# Patient Record
Sex: Female | Born: 1945 | Race: White | Hispanic: No | Marital: Married | Smoking: Never smoker
Health system: Southern US, Community
[De-identification: ages and names within clinical notes are randomized; demographics above are authoritative.]

## PROBLEM LIST (undated history)

## (undated) DIAGNOSIS — I4891 Unspecified atrial fibrillation: Secondary | ICD-10-CM

## (undated) DIAGNOSIS — D369 Benign neoplasm, unspecified site: Secondary | ICD-10-CM

## (undated) DIAGNOSIS — M1990 Unspecified osteoarthritis, unspecified site: Secondary | ICD-10-CM

## (undated) DIAGNOSIS — E538 Deficiency of other specified B group vitamins: Secondary | ICD-10-CM

## (undated) DIAGNOSIS — I8390 Asymptomatic varicose veins of unspecified lower extremity: Secondary | ICD-10-CM

## (undated) DIAGNOSIS — I10 Essential (primary) hypertension: Secondary | ICD-10-CM

## (undated) HISTORY — PX: CHOLECYSTECTOMY: SHX55

## (undated) HISTORY — PX: COLONOSCOPY: SHX174

## (undated) HISTORY — PX: ABDOMINAL HYSTERECTOMY: SHX81

## (undated) HISTORY — PX: OOPHORECTOMY: SHX86

---

## 1998-02-15 ENCOUNTER — Inpatient Hospital Stay (HOSPITAL_COMMUNITY): Admission: RE | Admit: 1998-02-15 | Discharge: 1998-02-17 | Payer: Self-pay | Admitting: Obstetrics and Gynecology

## 1999-04-06 ENCOUNTER — Other Ambulatory Visit: Admission: RE | Admit: 1999-04-06 | Discharge: 1999-04-06 | Payer: Self-pay | Admitting: Obstetrics and Gynecology

## 2004-05-07 ENCOUNTER — Ambulatory Visit: Payer: Self-pay | Admitting: Internal Medicine

## 2004-10-09 ENCOUNTER — Ambulatory Visit: Payer: Self-pay

## 2005-07-17 ENCOUNTER — Ambulatory Visit: Payer: Self-pay | Admitting: Internal Medicine

## 2006-07-21 ENCOUNTER — Ambulatory Visit: Payer: Self-pay | Admitting: Unknown Physician Specialty

## 2006-09-18 ENCOUNTER — Ambulatory Visit: Payer: Self-pay | Admitting: Internal Medicine

## 2007-10-08 ENCOUNTER — Ambulatory Visit: Payer: Self-pay | Admitting: Internal Medicine

## 2008-11-22 ENCOUNTER — Ambulatory Visit: Payer: Self-pay | Admitting: Internal Medicine

## 2010-02-05 ENCOUNTER — Ambulatory Visit: Payer: Self-pay | Admitting: Internal Medicine

## 2010-03-16 ENCOUNTER — Ambulatory Visit: Payer: Self-pay | Admitting: Internal Medicine

## 2011-03-21 ENCOUNTER — Ambulatory Visit: Payer: Self-pay | Admitting: Internal Medicine

## 2011-06-26 ENCOUNTER — Ambulatory Visit: Payer: Self-pay | Admitting: Orthopedic Surgery

## 2012-04-06 ENCOUNTER — Ambulatory Visit: Payer: Self-pay | Admitting: Internal Medicine

## 2013-04-12 ENCOUNTER — Ambulatory Visit: Payer: Self-pay | Admitting: Internal Medicine

## 2014-05-12 ENCOUNTER — Ambulatory Visit: Payer: Self-pay | Admitting: Internal Medicine

## 2015-03-13 ENCOUNTER — Other Ambulatory Visit: Payer: Self-pay | Admitting: Internal Medicine

## 2015-03-13 DIAGNOSIS — Z1231 Encounter for screening mammogram for malignant neoplasm of breast: Secondary | ICD-10-CM

## 2015-05-15 ENCOUNTER — Ambulatory Visit
Admission: RE | Admit: 2015-05-15 | Discharge: 2015-05-15 | Disposition: A | Discharge status: Home / Self Care | Payer: Medicare HMO | Source: Ambulatory Visit | Attending: Internal Medicine | Admitting: Internal Medicine

## 2015-05-15 ENCOUNTER — Other Ambulatory Visit: Payer: Self-pay | Admitting: Internal Medicine

## 2015-05-15 DIAGNOSIS — Z1231 Encounter for screening mammogram for malignant neoplasm of breast: Secondary | ICD-10-CM | POA: Insufficient documentation

## 2016-03-14 ENCOUNTER — Other Ambulatory Visit: Payer: Self-pay | Admitting: Internal Medicine

## 2016-03-14 DIAGNOSIS — Z1231 Encounter for screening mammogram for malignant neoplasm of breast: Secondary | ICD-10-CM

## 2016-05-16 ENCOUNTER — Ambulatory Visit
Admission: RE | Admit: 2016-05-16 | Discharge: 2016-05-16 | Disposition: A | Discharge status: Home / Self Care | Payer: Medicare HMO | Source: Ambulatory Visit | Attending: Internal Medicine | Admitting: Internal Medicine

## 2016-05-16 DIAGNOSIS — Z1231 Encounter for screening mammogram for malignant neoplasm of breast: Secondary | ICD-10-CM | POA: Diagnosis present

## 2017-03-20 ENCOUNTER — Other Ambulatory Visit: Payer: Self-pay | Admitting: Internal Medicine

## 2017-03-20 DIAGNOSIS — Z1231 Encounter for screening mammogram for malignant neoplasm of breast: Secondary | ICD-10-CM

## 2017-05-21 ENCOUNTER — Ambulatory Visit
Admission: RE | Admit: 2017-05-21 | Discharge: 2017-05-21 | Disposition: A | Discharge status: Home / Self Care | Payer: Medicare HMO | Source: Ambulatory Visit | Attending: Internal Medicine | Admitting: Internal Medicine

## 2017-05-21 DIAGNOSIS — Z1231 Encounter for screening mammogram for malignant neoplasm of breast: Secondary | ICD-10-CM | POA: Diagnosis not present

## 2018-04-16 ENCOUNTER — Other Ambulatory Visit: Payer: Self-pay | Admitting: Internal Medicine

## 2018-04-16 DIAGNOSIS — Z1231 Encounter for screening mammogram for malignant neoplasm of breast: Secondary | ICD-10-CM

## 2018-05-25 ENCOUNTER — Ambulatory Visit
Admission: RE | Admit: 2018-05-25 | Discharge: 2018-05-25 | Disposition: A | Discharge status: Home / Self Care | Payer: Medicare HMO | Source: Ambulatory Visit | Attending: Internal Medicine | Admitting: Internal Medicine

## 2018-05-25 DIAGNOSIS — Z1231 Encounter for screening mammogram for malignant neoplasm of breast: Secondary | ICD-10-CM | POA: Diagnosis not present

## 2019-04-19 ENCOUNTER — Other Ambulatory Visit: Payer: Self-pay | Admitting: Internal Medicine

## 2019-04-19 DIAGNOSIS — Z1231 Encounter for screening mammogram for malignant neoplasm of breast: Secondary | ICD-10-CM

## 2019-05-31 ENCOUNTER — Encounter (INDEPENDENT_AMBULATORY_CARE_PROVIDER_SITE_OTHER): Payer: Self-pay

## 2019-05-31 ENCOUNTER — Other Ambulatory Visit: Payer: Self-pay

## 2019-05-31 ENCOUNTER — Ambulatory Visit
Admission: RE | Admit: 2019-05-31 | Discharge: 2019-05-31 | Disposition: A | Discharge status: Home / Self Care | Payer: Medicare HMO | Source: Ambulatory Visit | Attending: Internal Medicine | Admitting: Internal Medicine

## 2019-05-31 DIAGNOSIS — Z1231 Encounter for screening mammogram for malignant neoplasm of breast: Secondary | ICD-10-CM | POA: Diagnosis present

## 2020-05-03 ENCOUNTER — Other Ambulatory Visit: Payer: Self-pay | Admitting: Internal Medicine

## 2020-05-03 DIAGNOSIS — Z1231 Encounter for screening mammogram for malignant neoplasm of breast: Secondary | ICD-10-CM

## 2020-06-01 ENCOUNTER — Other Ambulatory Visit: Payer: Self-pay

## 2020-06-01 ENCOUNTER — Ambulatory Visit
Admission: RE | Admit: 2020-06-01 | Discharge: 2020-06-01 | Disposition: A | Discharge status: Home / Self Care | Payer: Medicare HMO | Source: Ambulatory Visit | Attending: Internal Medicine | Admitting: Internal Medicine

## 2020-06-01 DIAGNOSIS — Z1231 Encounter for screening mammogram for malignant neoplasm of breast: Secondary | ICD-10-CM | POA: Diagnosis present

## 2020-06-25 ENCOUNTER — Other Ambulatory Visit: Payer: Self-pay

## 2020-06-25 ENCOUNTER — Encounter: Payer: Self-pay | Admitting: Emergency Medicine

## 2020-06-25 ENCOUNTER — Ambulatory Visit
Admission: EM | Admit: 2020-06-25 | Discharge: 2020-06-25 | Disposition: A | Discharge status: Home / Self Care | Payer: Medicare HMO | Attending: Emergency Medicine | Admitting: Emergency Medicine

## 2020-06-25 DIAGNOSIS — S61213S Laceration without foreign body of left middle finger without damage to nail, sequela: Secondary | ICD-10-CM

## 2020-06-25 DIAGNOSIS — S61211S Laceration without foreign body of left index finger without damage to nail, sequela: Secondary | ICD-10-CM

## 2020-06-25 NOTE — ED Triage Notes (Signed)
Patient states that she was moving items from her camper shed on Friday.  Patient states that a wooden board slipped and cut her left 2nd and 3rd finger.  Patient has minimal bleeding at this time.

## 2020-06-25 NOTE — Discharge Instructions (Signed)
You were seen for cuts to your fingers and are being treated for the same.   Keep the area of the cuts clean and dry.  No need to cover the area of there is no drainage.  Signs of infection are warm to the area of the laceration, yellow-green drainage, fever, or overall malaise.  If any of the symptoms occur, make sure you come back and get reevaluated.  Take care, Dr. Sharlet Salina, NP-c

## 2020-06-25 NOTE — ED Provider Notes (Signed)
[facility] - [place], [place]   Name: Joanne Coffey DOB: [birth year] MRN: [id number] CSN: [id number] PCP: Danella Penton, MD  Arrival date and time:  06/25/20 1441  Chief Complaint:  Extremity Laceration (Left 2nd and 3rd finger)   NOTE: Prior to seeing the patient today, I have reviewed the triage nursing documentation and vital signs. Clinical staff has updated patient's PMH/PSHx, current medication list, and drug allergies/intolerances to ensure comprehensive history available to assist in medical decision making.   History:   HPI: Joanne Coffey is a 75 y.o. female who presents today with complaints of a finger laceration that occurred approximately over  48 hours ago.  She was working in the shed with her husband when her husband split a plank of wood to her and a plank of wood hit the top of her left hand.  She had 2 lacerations pointer and middle fingers.  She cleaned the area at home and used Steri-Strips to loosely approximate.  She is here today for possible tetanus vaccine and further evaluation of her hand due to swelling.  Patient's last tetanus vaccine was in November 2017.  Patient is left-handed.    History reviewed. No pertinent past medical history.  Past Surgical History:  Procedure Laterality Date  . ABDOMINAL HYSTERECTOMY    . OOPHORECTOMY      Family History  Problem Relation Age of Onset  . Breast cancer Neg Hx     Social History   Tobacco Use  . Smoking status: Never Smoker  . Smokeless tobacco: Never Used  Vaping Use  . Vaping Use: Never used  Substance Use Topics  . Alcohol use: Not Currently  . Drug use: Never    There are no problems to display for this patient.   Home Medications:    Current Meds  Medication Sig  . ascorbic acid (VITAMIN C) 500 MG tablet Take by mouth.  . Cholecalciferol 50 MCG (2000 UT) CAPS Take by mouth.  Marland Kitchen lisinopril-hydrochlorothiazide (ZESTORETIC) 20-12.5 MG tablet Take 1 tablet by mouth daily.  Marland Kitchen  omeprazole (PRILOSEC) 40 MG capsule Take by mouth.    Allergies:   Other  Review of Systems (ROS): Review of Systems  Skin: Positive for wound. Negative for pallor and rash.  All other systems reviewed and are negative.    Vital Signs: Today's Vitals   06/25/20 1449 06/25/20 1452  BP:  (!) 164/102  Pulse:  82  Resp:  14  Temp:  98.1 F (36.7 C)  TempSrc:  Oral  SpO2:  99%  Weight: 168 lb (76.2 kg)   Height: 5\' 7"  (1.702 m)   PainSc: 0-No pain     Physical Exam: Physical Exam Vitals and nursing note reviewed.  Constitutional:      Appearance: Normal appearance.  Cardiovascular:     Rate and Rhythm: Normal rate and regular rhythm.     Pulses: Normal pulses.     Heart sounds: Normal heart sounds.  Pulmonary:     Effort: Pulmonary effort is normal.     Breath sounds: Normal breath sounds.  Skin:    General: Skin is warm and dry.     Findings: Laceration present.       Neurological:     General: No focal deficit present.     Mental Status: She is alert and oriented to person, place, and time.  Psychiatric:        Mood and Affect: Mood normal.  Behavior: Behavior normal.      Urgent Care Treatments / Results:   LABS: PLEASE NOTE: all labs that were ordered this encounter are listed, however only abnormal results are displayed. Labs Reviewed - No data to display  EKG: -None  RADIOLOGY: No results found.  PROCEDURES: Procedures  MEDICATIONS RECEIVED THIS VISIT: Medications - No data to display  PERTINENT CLINICAL COURSE NOTES/UPDATES:   Initial Impression / Assessment and Plan / Urgent Care Course:  Pertinent labs & imaging results that were available during my care of the patient were personally reviewed by me and considered in my medical decision making (see lab/imaging section of note for values and interpretations).  Joanne Coffey is a 75 y.o. female who presents to [facility] today with complaints of lacerations to fingers,  diagnosed with the same, and treated as such with the directions below. NP and patient reviewed discharge instructions below during visit.   Patient is well appearing overall in clinic today. She does not appear to be in any acute distress. Presenting symptoms (see HPI) and exam as documented above.   I have reviewed the follow up and strict return precautions for any new or worsening symptoms. Patient is aware of symptoms that would be deemed urgent/emergent, and would thus require further evaluation either here or in the emergency department. At the time of discharge, she verbalized understanding and consent with the discharge plan as it was reviewed with her. All questions were fielded by provider and/or clinic staff prior to patient discharge.    Final Clinical Impressions / Urgent Care Diagnoses:   Final diagnoses:  Laceration of left index finger without damage to nail, foreign body presence unspecified, sequela  Laceration of left middle finger without damage to nail, foreign body presence unspecified, sequela    New Prescriptions:  [place] Controlled Substance Registry consulted? Not Applicable  No orders of the defined types were placed in this encounter.     Discharge Instructions     You were seen for cuts to your fingers and are being treated for the same.   Keep the area of the cuts clean and dry.  No need to cover the area of there is no drainage.  Signs of infection are warm to the area of the laceration, yellow-green drainage, fever, or overall malaise.  If any of the symptoms occur, make sure you come back and get reevaluated.  Take care, Dr. Sharlet Salina, NP-c     Recommended Follow up Care:  Patient encouraged to follow up with the following provider within the specified time frame, or sooner as dictated by the severity of her symptoms. As always, she was instructed that for any urgent/emergent care needs, she should seek care either here or in the emergency department for  more immediate evaluation.   Bailey Mech, DNP, NP-c    Bailey Mech, NP 06/25/20 1529

## 2020-07-20 ENCOUNTER — Encounter: Payer: Self-pay | Admitting: Ophthalmology

## 2020-07-26 NOTE — Discharge Instructions (Signed)

## 2020-07-28 ENCOUNTER — Other Ambulatory Visit: Payer: Self-pay

## 2020-07-28 ENCOUNTER — Ambulatory Visit
Admission: RE | Admit: 2020-07-28 | Discharge: 2020-07-28 | Disposition: A | Discharge status: Home / Self Care | Payer: Medicare HMO | Attending: Ophthalmology | Admitting: Ophthalmology

## 2020-07-28 ENCOUNTER — Encounter: Payer: Self-pay | Admitting: Ophthalmology

## 2020-07-28 ENCOUNTER — Encounter
Admission: RE | Disposition: A | Discharge status: Home / Self Care | Payer: Self-pay | Source: Home / Self Care | Attending: Ophthalmology

## 2020-07-28 ENCOUNTER — Ambulatory Visit: Payer: Medicare HMO | Admitting: Anesthesiology

## 2020-07-28 DIAGNOSIS — H2512 Age-related nuclear cataract, left eye: Secondary | ICD-10-CM | POA: Insufficient documentation

## 2020-07-28 DIAGNOSIS — Z79899 Other long term (current) drug therapy: Secondary | ICD-10-CM | POA: Insufficient documentation

## 2020-07-28 DIAGNOSIS — I4891 Unspecified atrial fibrillation: Secondary | ICD-10-CM | POA: Diagnosis not present

## 2020-07-28 HISTORY — DX: Asymptomatic varicose veins of unspecified lower extremity: I83.90

## 2020-07-28 HISTORY — PX: CATARACT EXTRACTION W/PHACO: SHX586

## 2020-07-28 HISTORY — DX: Unspecified osteoarthritis, unspecified site: M19.90

## 2020-07-28 SURGERY — PHACOEMULSIFICATION, CATARACT, WITH IOL INSERTION
Anesthesia: Monitor Anesthesia Care | Site: Eye | Laterality: Left

## 2020-07-28 MED ORDER — FENTANYL CITRATE (PF) 100 MCG/2ML IJ SOLN
INTRAMUSCULAR | Status: DC | PRN
  Administered 2020-07-28: 50 ug via INTRAVENOUS

## 2020-07-28 MED ORDER — ARMC OPHTHALMIC DILATING DROPS
1.0000 "application " | OPHTHALMIC | Status: DC | PRN
  Administered 2020-07-28 (×3): 1 via OPHTHALMIC

## 2020-07-28 MED ORDER — CEFUROXIME OPHTHALMIC INJECTION 1 MG/0.1 ML
INJECTION | OPHTHALMIC | Status: DC | PRN
  Administered 2020-07-28: 0.1 mL via INTRACAMERAL

## 2020-07-28 MED ORDER — MIDAZOLAM HCL 2 MG/2ML IJ SOLN
INTRAMUSCULAR | Status: DC | PRN
  Administered 2020-07-28: .5 mg via INTRAVENOUS
  Administered 2020-07-28: 1.5 mg via INTRAVENOUS

## 2020-07-28 MED ORDER — BRIMONIDINE TARTRATE-TIMOLOL 0.2-0.5 % OP SOLN
OPHTHALMIC | Status: DC | PRN
  Administered 2020-07-28: 1 [drp] via OPHTHALMIC

## 2020-07-28 MED ORDER — LIDOCAINE HCL (PF) 2 % IJ SOLN
INTRAOCULAR | Status: DC | PRN
  Administered 2020-07-28: 1 mL

## 2020-07-28 MED ORDER — TETRACAINE HCL 0.5 % OP SOLN
1.0000 [drp] | OPHTHALMIC | Status: DC | PRN
  Administered 2020-07-28 (×3): 1 [drp] via OPHTHALMIC

## 2020-07-28 MED ORDER — EPINEPHRINE PF 1 MG/ML IJ SOLN
INTRAOCULAR | Status: DC | PRN
  Administered 2020-07-28: 59 mL via OPHTHALMIC

## 2020-07-28 MED ORDER — NA HYALUR & NA CHOND-NA HYALUR 0.4-0.35 ML IO KIT
PACK | INTRAOCULAR | Status: DC | PRN
  Administered 2020-07-28: 1 mL via INTRAOCULAR

## 2020-07-28 SURGICAL SUPPLY — 19 items
CANNULA ANT/CHMB 27GA (MISCELLANEOUS) ×2 IMPLANT
GLOVE SURG LX 7.5 STRW (GLOVE) ×1
GLOVE SURG LX STRL 7.5 STRW (GLOVE) ×1 IMPLANT
GLOVE SURG TRIUMPH 8.0 PF LTX (GLOVE) ×2 IMPLANT
GOWN STRL REUS W/ TWL LRG LVL3 (GOWN DISPOSABLE) ×2 IMPLANT
GOWN STRL REUS W/TWL LRG LVL3 (GOWN DISPOSABLE) ×4
LENS IOL TECNIS EYHANCE 17.5 (Intraocular Lens) ×2 IMPLANT
MARKER SKIN DUAL TIP RULER LAB (MISCELLANEOUS) ×2 IMPLANT
NEEDLE CAPSULORHEX 25GA (NEEDLE) ×2 IMPLANT
NEEDLE FILTER BLUNT 18X 1/2SAF (NEEDLE) ×2
NEEDLE FILTER BLUNT 18X1 1/2 (NEEDLE) ×2 IMPLANT
PACK CATARACT BRASINGTON (MISCELLANEOUS) ×2 IMPLANT
PACK EYE AFTER SURG (MISCELLANEOUS) ×2 IMPLANT
PACK OPTHALMIC (MISCELLANEOUS) ×2 IMPLANT
SOLUTION OPHTHALMIC SALT (MISCELLANEOUS) ×2 IMPLANT
SYR 3ML LL SCALE MARK (SYRINGE) ×4 IMPLANT
SYR TB 1ML LUER SLIP (SYRINGE) ×2 IMPLANT
WATER STERILE IRR 250ML POUR (IV SOLUTION) ×2 IMPLANT
WIPE NON LINTING 3.25X3.25 (MISCELLANEOUS) ×2 IMPLANT

## 2020-07-28 NOTE — Anesthesia Preprocedure Evaluation (Signed)
Anesthesia Evaluation  Patient identified by MRN, date of birth, ID band Patient awake    History of Anesthesia Complications Negative for: history of anesthetic complications  Airway Mallampati: II  TM Distance: >3 FB Neck ROM: Full    Dental no notable dental hx.    Pulmonary neg pulmonary ROS,    Pulmonary exam normal        Cardiovascular Exercise Tolerance: Good hypertension, Normal cardiovascular exam     Neuro/Psych negative neurological ROS     GI/Hepatic Neg liver ROS, GERD  ,  Endo/Other  negative endocrine ROS  Renal/GU negative Renal ROS     Musculoskeletal   Abdominal   Peds  Hematology negative hematology ROS (+)   Anesthesia Other Findings   Reproductive/Obstetrics                             Anesthesia Physical Anesthesia Plan  ASA: II  Anesthesia Plan: MAC   Post-op Pain Management:    Induction: Intravenous  PONV Risk Score and Plan: 2 and TIVA, Midazolam and Treatment may vary due to age or medical condition  Airway Management Planned: Nasal Cannula and Natural Airway  Additional Equipment: None  Intra-op Plan:   Post-operative Plan:   Informed Consent: I have reviewed the patients History and Physical, chart, labs and discussed the procedure including the risks, benefits and alternatives for the proposed anesthesia with the patient or authorized representative who has indicated his/her understanding and acceptance.       Plan Discussed with: CRNA  Anesthesia Plan Comments:         Anesthesia Quick Evaluation

## 2020-07-28 NOTE — H&P (Signed)
  [facility]   Primary Care Physician:  Danella Penton, MD Ophthalmologist: Dr. Lockie Mola  Pre-Procedure History & Physical: HPI:  Joanne Coffey is a 75 y.o. female here for ophthalmic surgery.   Past Medical History:  Diagnosis Date  . Arthritis    fingers  . Varicose veins of lower extremity     Past Surgical History:  Procedure Laterality Date  . ABDOMINAL HYSTERECTOMY    . OOPHORECTOMY      Prior to Admission medications   Medication Sig Start Date End Date Taking? Authorizing Provider  ascorbic acid (VITAMIN C) 500 MG tablet Take by mouth.   Yes [provider]  Cholecalciferol 50 MCG (2000 UT) CAPS Take by mouth.   Yes [provider]  lisinopril-hydrochlorothiazide (ZESTORETIC) 20-12.5 MG tablet Take 1 tablet by mouth daily. 05/22/20  Yes [provider]  omeprazole (PRILOSEC) 40 MG capsule Take by mouth. 06/22/20 06/22/21 Yes [provider]    Allergies as of 06/15/2020  . (Not on File)    Family History  Problem Relation Age of Onset  . Breast cancer Neg Hx     Social History   Socioeconomic History  . Marital status: Married    Spouse name: Not on file  . Number of children: Not on file  . Years of education: Not on file  . Highest education level: Not on file  Occupational History  . Not on file  Tobacco Use  . Smoking status: Never Smoker  . Smokeless tobacco: Never Used  Vaping Use  . Vaping Use: Never used  Substance and Sexual Activity  . Alcohol use: Not Currently  . Drug use: Never  . Sexual activity: Not on file  Other Topics Concern  . Not on file  Social History Narrative  . Not on file   Social Determinants of Health   Financial Resource Strain: Not on file  Food Insecurity: Not on file  Transportation Needs: Not on file  Physical Activity: Not on file  Stress: Not on file  Social Connections: Not on file  Intimate Partner Violence: Not on file    Review of Systems: See  HPI, otherwise negative ROS  Physical Exam: BP (!) 168/105   Pulse 67   Temp (!) 97.5 F (36.4 C) (Temporal)   Resp 20   Ht 5\' 7"  (1.702 m)   Wt 72.6 kg   SpO2 97%   BMI 25.06 kg/m  General:   Alert,  pleasant and cooperative in NAD Head:  Normocephalic and atraumatic. Lungs:  Clear to auscultation.    Heart:  Regular rate and rhythm.   Impression/Plan: Joanne Coffey is here for ophthalmic surgery.  Risks, benefits, limitations, and alternatives regarding ophthalmic surgery have been reviewed with the patient.  Questions have been answered.  All parties agreeable.   [name], MD  07/28/2020, 8:44 AM

## 2020-07-28 NOTE — Op Note (Signed)
  OPERATIVE NOTE  Joanne Coffey [id number] 07/28/2020   PREOPERATIVE DIAGNOSIS:  Nuclear sclerotic cataract left eye. H25.12   POSTOPERATIVE DIAGNOSIS:    Nuclear sclerotic cataract left eye.     PROCEDURE:  Phacoemusification with posterior chamber intraocular lens placement of the left eye  Ultrasound time: Procedure(s): CATARACT EXTRACTION PHACO AND INTRAOCULAR LENS PLACEMENT (IOC) LEFT 9.18 01:08.4 13.4% (Left)  LENS:   Implant Name Type Inv. Item Serial No. Manufacturer Lot No. LRB No. Used Action  LENS IOL TECNIS EYHANCE 17.5 - [id number] Intraocular Lens LENS IOL TECNIS EYHANCE 17.5 [id number] JOHNSON   Left 1 Implanted      SURGEON:  Deirdre Evener, MD   ANESTHESIA:  Topical with tetracaine drops and 2% Xylocaine jelly, augmented with 1% preservative-free intracameral lidocaine.    COMPLICATIONS:  None.   DESCRIPTION OF PROCEDURE:  The patient was identified in the holding room and transported to the operating room and placed in the supine position under the operating microscope.  The left eye was identified as the operative eye and it was prepped and draped in the usual sterile ophthalmic fashion.   A 1 millimeter clear-corneal paracentesis was made at the 1:30 position.  0.5 ml of preservative-free 1% lidocaine was injected into the anterior chamber.  The anterior chamber was filled with Viscoat viscoelastic.  A 2.4 millimeter keratome was used to make a near-clear corneal incision at the 10:30 position.  .  A curvilinear capsulorrhexis was made with a cystotome and capsulorrhexis forceps.  Balanced salt solution was used to hydrodissect and hydrodelineate the nucleus.   Phacoemulsification was then used in stop and chop fashion to remove the lens nucleus and epinucleus.  The remaining cortex was then removed using the irrigation and aspiration handpiece. Provisc was then placed into the capsular bag to distend it for lens placement.  A lens was then injected into  the capsular bag.  The remaining viscoelastic was aspirated.   Wounds were hydrated with balanced salt solution.  The anterior chamber was inflated to a physiologic pressure with balanced salt solution.  No wound leaks were noted. Cefuroxime 0.1 ml of a 10mg /ml solution was injected into the anterior chamber for a dose of 1 mg of intracameral antibiotic at the completion of the case.   Timolol and Brimonidine drops were applied to the eye.  The patient was taken to the recovery room in stable condition without complications of anesthesia or surgery.  [name] 07/28/2020, 9:39 AM

## 2020-07-28 NOTE — Transfer of Care (Signed)
Immediate Anesthesia Transfer of Care Note  Patient: Joanne Coffey  Procedure(s) Performed: CATARACT EXTRACTION PHACO AND INTRAOCULAR LENS PLACEMENT (IOC) LEFT 9.18 01:08.4 13.4% (Left Eye)  Patient Location: PACU  Anesthesia Type: MAC  Level of Consciousness: awake, alert  and patient cooperative  Airway and Oxygen Therapy: Patient Spontanous Breathing and Patient connected to supplemental oxygen  Post-op Assessment: Post-op Vital signs reviewed, Patient's Cardiovascular Status Stable, Respiratory Function Stable, Patent Airway and No signs of Nausea or vomiting  Post-op Vital Signs: Reviewed and stable  Complications: No complications documented.

## 2020-07-28 NOTE — Addendum Note (Signed)
Addendum  created 07/28/20 1114 by Zamiyah Resendes, Wille Celeste, MD   Intraprocedure Event edited

## 2020-07-28 NOTE — Anesthesia Procedure Notes (Signed)
Procedure Name: MAC Performed by: Kratos Ruscitti, CRNA Pre-anesthesia Checklist: Patient identified, Emergency Drugs available, Suction available, Timeout performed and Patient being monitored Patient Re-evaluated:Patient Re-evaluated prior to induction Oxygen Delivery Method: Nasal cannula Placement Confirmation: positive ETCO2       

## 2020-07-28 NOTE — Anesthesia Postprocedure Evaluation (Signed)
Anesthesia Post Note  Patient: Joanne Coffey  Procedure(s) Performed: CATARACT EXTRACTION PHACO AND INTRAOCULAR LENS PLACEMENT (IOC) LEFT 9.18 01:08.4 13.4% (Left Eye)     Patient location during evaluation: PACU Anesthesia Type: MAC Level of consciousness: awake and alert Pain management: pain level controlled Vital Signs Assessment: post-procedure vital signs reviewed and stable Respiratory status: spontaneous breathing Cardiovascular status: blood pressure returned to baseline Postop Assessment: no apparent nausea or vomiting, adequate PO intake and no headache Anesthetic complications: no   No complications documented.  Adele Barthel Keymarion Bearman

## 2020-07-31 ENCOUNTER — Encounter: Payer: Self-pay | Admitting: Ophthalmology

## 2020-08-01 ENCOUNTER — Encounter: Payer: Self-pay | Admitting: Ophthalmology

## 2020-08-01 ENCOUNTER — Other Ambulatory Visit: Payer: Self-pay

## 2020-08-07 NOTE — Discharge Instructions (Signed)

## 2020-08-09 ENCOUNTER — Ambulatory Visit
Admission: RE | Admit: 2020-08-09 | Discharge: 2020-08-09 | Disposition: A | Discharge status: Home / Self Care | Payer: Medicare HMO | Attending: Ophthalmology | Admitting: Ophthalmology

## 2020-08-09 ENCOUNTER — Encounter
Admission: RE | Disposition: A | Discharge status: Home / Self Care | Payer: Self-pay | Source: Home / Self Care | Attending: Ophthalmology

## 2020-08-09 ENCOUNTER — Encounter: Payer: Self-pay | Admitting: Ophthalmology

## 2020-08-09 ENCOUNTER — Ambulatory Visit: Payer: Medicare HMO | Admitting: Anesthesiology

## 2020-08-09 ENCOUNTER — Other Ambulatory Visit: Payer: Self-pay

## 2020-08-09 DIAGNOSIS — Z79899 Other long term (current) drug therapy: Secondary | ICD-10-CM | POA: Diagnosis not present

## 2020-08-09 DIAGNOSIS — Z7982 Long term (current) use of aspirin: Secondary | ICD-10-CM | POA: Diagnosis not present

## 2020-08-09 DIAGNOSIS — H2511 Age-related nuclear cataract, right eye: Secondary | ICD-10-CM | POA: Diagnosis not present

## 2020-08-09 HISTORY — PX: CATARACT EXTRACTION W/PHACO: SHX586

## 2020-08-09 HISTORY — DX: Unspecified atrial fibrillation: I48.91

## 2020-08-09 SURGERY — PHACOEMULSIFICATION, CATARACT, WITH IOL INSERTION
Anesthesia: Monitor Anesthesia Care | Site: Eye | Laterality: Right

## 2020-08-09 MED ORDER — NA HYALUR & NA CHOND-NA HYALUR 0.4-0.35 ML IO KIT
PACK | INTRAOCULAR | Status: DC | PRN
  Administered 2020-08-09: 1 mL via INTRAOCULAR

## 2020-08-09 MED ORDER — CEFUROXIME OPHTHALMIC INJECTION 1 MG/0.1 ML
INJECTION | OPHTHALMIC | Status: DC | PRN
  Administered 2020-08-09: 0.1 mL via INTRACAMERAL

## 2020-08-09 MED ORDER — OXYCODONE HCL 5 MG PO TABS: 5.0000 mg | ORAL_TABLET | Freq: Once | ORAL | Status: DC | PRN

## 2020-08-09 MED ORDER — LIDOCAINE HCL (PF) 2 % IJ SOLN
INTRAOCULAR | Status: DC | PRN
  Administered 2020-08-09: 2 mL

## 2020-08-09 MED ORDER — MIDAZOLAM HCL 2 MG/2ML IJ SOLN
INTRAMUSCULAR | Status: DC | PRN
  Administered 2020-08-09: 2 mg via INTRAVENOUS

## 2020-08-09 MED ORDER — ARMC OPHTHALMIC DILATING DROPS
1.0000 "application " | OPHTHALMIC | Status: DC | PRN
  Administered 2020-08-09 (×3): 1 via OPHTHALMIC

## 2020-08-09 MED ORDER — FENTANYL CITRATE (PF) 100 MCG/2ML IJ SOLN
INTRAMUSCULAR | Status: DC | PRN
  Administered 2020-08-09 (×2): 50 ug via INTRAVENOUS

## 2020-08-09 MED ORDER — OXYCODONE HCL 5 MG/5ML PO SOLN: 5.0000 mg | Freq: Once | ORAL | Status: DC | PRN

## 2020-08-09 MED ORDER — BRIMONIDINE TARTRATE-TIMOLOL 0.2-0.5 % OP SOLN
OPHTHALMIC | Status: DC | PRN
  Administered 2020-08-09: 1 [drp] via OPHTHALMIC

## 2020-08-09 MED ORDER — LACTATED RINGERS IV SOLN: INTRAVENOUS | Status: DC

## 2020-08-09 MED ORDER — EPINEPHRINE PF 1 MG/ML IJ SOLN
INTRAOCULAR | Status: DC | PRN
  Administered 2020-08-09: 70 mL via OPHTHALMIC

## 2020-08-09 MED ORDER — TETRACAINE HCL 0.5 % OP SOLN
1.0000 [drp] | OPHTHALMIC | Status: DC | PRN
  Administered 2020-08-09 (×3): 1 [drp] via OPHTHALMIC

## 2020-08-09 SURGICAL SUPPLY — 18 items
CANNULA ANT/CHMB 27GA (MISCELLANEOUS) ×2 IMPLANT
GLOVE SURG ENC TEXT LTX SZ7.5 (GLOVE) ×2 IMPLANT
GLOVE SURG TRIUMPH 8.0 PF LTX (GLOVE) ×2 IMPLANT
GOWN STRL REUS W/ TWL LRG LVL3 (GOWN DISPOSABLE) ×2 IMPLANT
GOWN STRL REUS W/TWL LRG LVL3 (GOWN DISPOSABLE) ×4
LENS IOL TECNIS EYHANCE 18.0 (Intraocular Lens) ×2 IMPLANT
MARKER SKIN DUAL TIP RULER LAB (MISCELLANEOUS) ×2 IMPLANT
NEEDLE CAPSULORHEX 25GA (NEEDLE) ×2 IMPLANT
NEEDLE FILTER BLUNT 18X 1/2SAF (NEEDLE) ×2
NEEDLE FILTER BLUNT 18X1 1/2 (NEEDLE) ×2 IMPLANT
PACK CATARACT BRASINGTON (MISCELLANEOUS) ×2 IMPLANT
PACK EYE AFTER SURG (MISCELLANEOUS) ×2 IMPLANT
PACK OPTHALMIC (MISCELLANEOUS) ×2 IMPLANT
SOLUTION OPHTHALMIC SALT (MISCELLANEOUS) ×2 IMPLANT
SYR 3ML LL SCALE MARK (SYRINGE) ×4 IMPLANT
SYR TB 1ML LUER SLIP (SYRINGE) ×2 IMPLANT
WATER STERILE IRR 250ML POUR (IV SOLUTION) ×2 IMPLANT
WIPE NON LINTING 3.25X3.25 (MISCELLANEOUS) ×2 IMPLANT

## 2020-08-09 NOTE — Anesthesia Postprocedure Evaluation (Signed)
Anesthesia Post Note  Patient: Joanne Coffey  Procedure(s) Performed: CATARACT EXTRACTION PHACO AND INTRAOCULAR LENS PLACEMENT (IOC) RIGHT 10.37 01:08.2 15.2% (Right Eye)     Patient location during evaluation: PACU Anesthesia Type: MAC Level of consciousness: awake and alert Pain management: pain level controlled Vital Signs Assessment: post-procedure vital signs reviewed and stable Respiratory status: spontaneous breathing, nonlabored ventilation, respiratory function stable and patient connected to nasal cannula oxygen Cardiovascular status: stable and blood pressure returned to baseline Postop Assessment: no apparent nausea or vomiting Anesthetic complications: no   No complications documented.  [name]

## 2020-08-09 NOTE — Anesthesia Procedure Notes (Signed)
Procedure Name: MAC Date/Time: 08/09/2020 12:37 PM Performed by: Jeannene Patella, CRNA Pre-anesthesia Checklist: Patient identified, Emergency Drugs available, Suction available, Timeout performed and Patient being monitored Patient Re-evaluated:Patient Re-evaluated prior to induction Oxygen Delivery Method: Nasal cannula Placement Confirmation: positive ETCO2

## 2020-08-09 NOTE — Anesthesia Preprocedure Evaluation (Signed)
Anesthesia Evaluation  Patient identified by MRN, date of birth, ID band Patient awake    History of Anesthesia Complications Negative for: history of anesthetic complications  Airway Mallampati: II  TM Distance: >3 FB Neck ROM: Full    Dental no notable dental hx.    Pulmonary neg pulmonary ROS,    Pulmonary exam normal        Cardiovascular Exercise Tolerance: Good hypertension, + dysrhythmias Atrial Fibrillation   echo: 4/22:  NORMAL LEFT VENTRICULAR SYSTOLIC FUNCTION withan estimated EF > 55%  NORMAL RIGHT VENTRICULAR SYSTOLIC FUNCTION  Mild Bi-Atrial enlargement  Mild Mitral, Tricuspid, and Pulmonic valve regurgitation;  ekg: 08/01/20: Atrial fibrillation ;   Neuro/Psych negative neurological ROS  negative psych ROS   GI/Hepatic Neg liver ROS, GERD  Controlled,  Endo/Other  negative endocrine ROS  Renal/GU negative Renal ROS     Musculoskeletal  (+) Arthritis ,   Abdominal   Peds  Hematology negative hematology ROS (+)   Anesthesia Other Findings pcp: 08/01/20: Sabra Heck;  Patient found to have atrial fibrillation during cataract surgery on 07/28/20.  last aspirin: 4/26;  Reproductive/Obstetrics                             Anesthesia Physical  Anesthesia Plan  ASA: II  Anesthesia Plan: MAC   Post-op Pain Management:    Induction: Intravenous  PONV Risk Score and Plan: 2 and TIVA and Midazolam  Airway Management Planned: Nasal Cannula and Natural Airway  Additional Equipment: None  Intra-op Plan:   Post-operative Plan:   Informed Consent: I have reviewed the patients History and Physical, chart, labs and discussed the procedure including the risks, benefits and alternatives for the proposed anesthesia with the patient or authorized representative who has indicated his/her understanding and acceptance.       Plan Discussed with: CRNA  Anesthesia Plan Comments:          Anesthesia Quick Evaluation

## 2020-08-09 NOTE — Op Note (Signed)
  LOCATION:  [facility]   PREOPERATIVE DIAGNOSIS:    Nuclear sclerotic cataract right eye. H25.11   POSTOPERATIVE DIAGNOSIS:  Nuclear sclerotic cataract right eye.     PROCEDURE:  Phacoemusification with posterior chamber intraocular lens placement of the right eye   ULTRASOUND TIME: Procedure(s): CATARACT EXTRACTION PHACO AND INTRAOCULAR LENS PLACEMENT (IOC) RIGHT 10.37 01:08.2 15.2% (Right)  LENS:   Implant Name Type Inv. Item Serial No. Manufacturer Lot No. LRB No. Used Action  LENS IOL TECNIS EYHANCE 18.0 - [id number] Intraocular Lens LENS IOL TECNIS EYHANCE 18.0 [id number] JOHNSON   Right 1 Implanted         SURGEON:  Deirdre Evener, MD   ANESTHESIA:  Topical with tetracaine drops and 2% Xylocaine jelly, augmented with 1% preservative-free intracameral lidocaine.    COMPLICATIONS:  None.   DESCRIPTION OF PROCEDURE:  The patient was identified in the holding room and transported to the operating room and placed in the supine position under the operating microscope.  The right eye was identified as the operative eye and it was prepped and draped in the usual sterile ophthalmic fashion.   A 1 millimeter clear-corneal paracentesis was made at the 12:00 position.  0.5 ml of preservative-free 1% lidocaine was injected into the anterior chamber. The anterior chamber was filled with Viscoat viscoelastic.  A 2.4 millimeter keratome was used to make a near-clear corneal incision at the 9:00 position.  A curvilinear capsulorrhexis was made with a cystotome and capsulorrhexis forceps.  Balanced salt solution was used to hydrodissect and hydrodelineate the nucleus.   Phacoemulsification was then used in stop and chop fashion to remove the lens nucleus and epinucleus.  The remaining cortex was then removed using the irrigation and aspiration handpiece. Provisc was then placed into the capsular bag to distend it for lens placement.  A lens was then injected into the capsular  bag.  The remaining viscoelastic was aspirated.   Wounds were hydrated with balanced salt solution.  The anterior chamber was inflated to a physiologic pressure with balanced salt solution.  No wound leaks were noted. Cefuroxime 0.1 ml of a 10mg /ml solution was injected into the anterior chamber for a dose of 1 mg of intracameral antibiotic at the completion of the case.   Timolol and Brimonidine drops were applied to the eye.  The patient was taken to the recovery room in stable condition without complications of anesthesia or surgery.   Taleeyah Bora 08/09/2020, 12:54 PM

## 2020-08-09 NOTE — Transfer of Care (Signed)
Immediate Anesthesia Transfer of Care Note  Patient: Joanne Coffey  Procedure(s) Performed: CATARACT EXTRACTION PHACO AND INTRAOCULAR LENS PLACEMENT (IOC) RIGHT 10.37 01:08.2 15.2% (Right Eye)  Patient Location: PACU  Anesthesia Type: MAC  Level of Consciousness: awake, alert  and patient cooperative  Airway and Oxygen Therapy: Patient Spontanous Breathing and Patient connected to supplemental oxygen  Post-op Assessment: Post-op Vital signs reviewed, Patient's Cardiovascular Status Stable, Respiratory Function Stable, Patent Airway and No signs of Nausea or vomiting  Post-op Vital Signs: Reviewed and stable  Complications: No complications documented.

## 2020-08-09 NOTE — H&P (Signed)
  [facility]   Primary Care Physician:  Danella Penton, MD Ophthalmologist: Dr. Lockie Mola  Pre-Procedure History & Physical: HPI:  Joanne Coffey is a 75 y.o. female here for ophthalmic surgery.   Past Medical History:  Diagnosis Date  . Arthritis    fingers  . Atrial fibrillation and flutter (HCC)   . Varicose veins of lower extremity     Past Surgical History:  Procedure Laterality Date  . ABDOMINAL HYSTERECTOMY    . CATARACT EXTRACTION W/PHACO Left 07/28/2020   Procedure: CATARACT EXTRACTION PHACO AND INTRAOCULAR LENS PLACEMENT (IOC) LEFT 9.18 01:08.4 13.4%;  Surgeon: Lockie Mola, MD;  Location: [facility];  Service: Ophthalmology;  Laterality: Left;  . OOPHORECTOMY      Prior to Admission medications   Medication Sig Start Date End Date Taking? Authorizing Provider  ascorbic acid (VITAMIN C) 500 MG tablet Take by mouth.   Yes [provider]  aspirin 81 MG EC tablet Take 81 mg by mouth daily. Swallow whole.   Yes [provider]  Cholecalciferol 50 MCG (2000 UT) CAPS Take by mouth.   Yes [provider]  ELDERBERRY PO Take by mouth every other day.   Yes [provider]  lisinopril-hydrochlorothiazide (ZESTORETIC) 20-12.5 MG tablet Take 1 tablet by mouth daily. 05/22/20  Yes [provider]  omeprazole (PRILOSEC) 40 MG capsule Take by mouth. 06/22/20 06/22/21 Yes [provider]  vitamin B-12 (CYANOCOBALAMIN) 1000 MCG tablet Take 1,000 mcg by mouth daily.   Yes [provider]    Allergies as of 06/15/2020  . (Not on File)    Family History  Problem Relation Age of Onset  . Breast cancer Neg Hx     Social History   Socioeconomic History  . Marital status: Married    Spouse name: Not on file  . Number of children: Not on file  . Years of education: Not on file  . Highest education level: Not on file  Occupational History  . Not on file  Tobacco Use  . Smoking  status: Never Smoker  . Smokeless tobacco: Never Used  Vaping Use  . Vaping Use: Never used  Substance and Sexual Activity  . Alcohol use: Not Currently  . Drug use: Never  . Sexual activity: Not on file  Other Topics Concern  . Not on file  Social History Narrative  . Not on file   Social Determinants of Health   Financial Resource Strain: Not on file  Food Insecurity: Not on file  Transportation Needs: Not on file  Physical Activity: Not on file  Stress: Not on file  Social Connections: Not on file  Intimate Partner Violence: Not on file    Review of Systems: See HPI, otherwise negative ROS  Physical Exam: Ht 5\' 6"  (1.676 m)   Wt 74.8 kg   BMI 26.63 kg/m  General:   Alert,  pleasant and cooperative in NAD Head:  Normocephalic and atraumatic. Lungs:  Clear to auscultation.    Heart:  Regular rate and rhythm.   Impression/Plan: Joanne Coffey is here for ophthalmic surgery.  Risks, benefits, limitations, and alternatives regarding ophthalmic surgery have been reviewed with the patient.  Questions have been answered.  All parties agreeable.   [name], MD  08/09/2020, 12:12 PM

## 2020-08-10 ENCOUNTER — Encounter: Payer: Self-pay | Admitting: Ophthalmology

## 2021-05-09 ENCOUNTER — Other Ambulatory Visit: Payer: Self-pay | Admitting: Internal Medicine

## 2021-05-09 DIAGNOSIS — Z1231 Encounter for screening mammogram for malignant neoplasm of breast: Secondary | ICD-10-CM

## 2021-06-19 ENCOUNTER — Other Ambulatory Visit: Payer: Self-pay

## 2021-06-19 ENCOUNTER — Ambulatory Visit
Admission: RE | Admit: 2021-06-19 | Discharge: 2021-06-19 | Disposition: A | Discharge status: Home / Self Care | Payer: Medicare Other | Source: Ambulatory Visit | Attending: Internal Medicine | Admitting: Internal Medicine

## 2021-06-19 DIAGNOSIS — Z1231 Encounter for screening mammogram for malignant neoplasm of breast: Secondary | ICD-10-CM | POA: Diagnosis not present

## 2022-05-09 ENCOUNTER — Other Ambulatory Visit: Payer: Self-pay | Admitting: Internal Medicine

## 2022-05-09 DIAGNOSIS — Z1231 Encounter for screening mammogram for malignant neoplasm of breast: Secondary | ICD-10-CM

## 2022-06-25 ENCOUNTER — Ambulatory Visit
Admission: RE | Admit: 2022-06-25 | Discharge: 2022-06-25 | Disposition: A | Discharge status: Home / Self Care | Payer: Medicare Other | Source: Ambulatory Visit | Attending: Internal Medicine | Admitting: Internal Medicine

## 2022-06-25 DIAGNOSIS — Z1231 Encounter for screening mammogram for malignant neoplasm of breast: Secondary | ICD-10-CM | POA: Insufficient documentation

## 2022-09-13 ENCOUNTER — Encounter: Payer: Self-pay | Admitting: Gastroenterology

## 2022-09-19 ENCOUNTER — Encounter: Payer: Self-pay | Admitting: Gastroenterology

## 2022-09-19 NOTE — H&P (Signed)
Pre-Procedure H&P   Patient ID: Joanne Coffey is a 77 y.o. female.  Gastroenterology Provider: Jaynie Collins, DO  Referring Provider: Fransico Setters, NP PCP: Danella Penton, MD  Date: 09/20/2022  HPI Ms. Joanne Coffey is a 77 y.o. female who presents today for Colonoscopy for Surveillance-personal history of colon polyps .  Patient reports moving her bowels regularly without melena or hematochezia.  No family history of colon cancer or colon polyps. Patient is status post cholecystectomy and hysterectomy. Last colonoscopy in September 2018 demonstrating 3 tubular adenomatous polyps, diverticulosis, internal hemorrhoids.  She also underwent colonoscopy in 2008 demonstrating diverticulosis internal hemorrhoids and hyperplastic polyps  Creatinine 0.7 hemoglobin 14.2 MCV 91 platelet 272,000   Past Medical History:  Diagnosis Date   Arthritis    fingers   Atrial fibrillation and flutter (HCC)    B12 deficiency    Hypertension    Tubular adenoma    Varicose veins of lower extremity     Past Surgical History:  Procedure Laterality Date   ABDOMINAL HYSTERECTOMY     CATARACT EXTRACTION W/PHACO Left 07/28/2020   Procedure: CATARACT EXTRACTION PHACO AND INTRAOCULAR LENS PLACEMENT (IOC) LEFT 9.18 01:08.4 13.4%;  Surgeon: Lockie Mola, MD;  Location: [facility];  Service: Ophthalmology;  Laterality: Left;   CATARACT EXTRACTION W/PHACO Right 08/09/2020   Procedure: CATARACT EXTRACTION PHACO AND INTRAOCULAR LENS PLACEMENT (IOC) RIGHT 10.37 01:08.2 15.2%;  Surgeon: Lockie Mola, MD;  Location: [facility];  Service: Ophthalmology;  Laterality: Right;   CHOLECYSTECTOMY     COLONOSCOPY     OOPHORECTOMY      Family History No h/o GI disease or malignancy  Review of Systems  Constitutional:  Negative for activity change, appetite change, chills, diaphoresis, fatigue, fever and unexpected weight change.  HENT:  Negative for trouble swallowing and  voice change.   Respiratory:  Negative for shortness of breath and wheezing.   Cardiovascular:  Negative for chest pain, palpitations and leg swelling.  Gastrointestinal:  Negative for abdominal distention, abdominal pain, anal bleeding, blood in stool, constipation, diarrhea, nausea, rectal pain and vomiting.  Musculoskeletal:  Negative for arthralgias and myalgias.  Skin:  Negative for color change and pallor.  Neurological:  Negative for dizziness, syncope and weakness.  Psychiatric/Behavioral:  Negative for confusion.   All other systems reviewed and are negative.    Medications No current facility-administered medications on file prior to encounter.   Current Outpatient Medications on File Prior to Encounter  Medication Sig Dispense Refill   ascorbic acid (VITAMIN C) 500 MG tablet Take by mouth.     aspirin 81 MG EC tablet Take 81 mg by mouth daily. Swallow whole.     Cholecalciferol 50 MCG (2000 UT) CAPS Take by mouth.     ELDERBERRY PO Take by mouth every other day.     fluticasone (FLONASE) 50 MCG/ACT nasal spray Place 2 sprays into both nostrils daily.     lisinopril-hydrochlorothiazide (ZESTORETIC) 20-12.5 MG tablet Take 1 tablet by mouth daily.     vitamin B-12 (CYANOCOBALAMIN) 1000 MCG tablet Take 1,000 mcg by mouth daily.     Zinc Acetate 50 MG CAPS Take 1 capsule by mouth daily.     omeprazole (PRILOSEC) 40 MG capsule Take by mouth.      Pertinent medications related to GI and procedure were reviewed by me with the patient prior to the procedure   Current Facility-Administered Medications:    0.9 %  sodium chloride infusion, , Intravenous, Continuous,  Jaynie Collins, DO, Last Rate: 20 mL/hr at 09/20/22 1241, New Bag at 09/20/22 1241  sodium chloride 20 mL/hr at 09/20/22 1241       No Known Allergies Allergies were reviewed by me prior to the procedure  Objective   Body mass index is 25.47 kg/m. Vitals:   09/20/22 1234  BP: (!) 170/93  Pulse: 78   Resp: 18  Temp: (!) 97.5 F (36.4 C)  TempSrc: Temporal  SpO2: 97%  Weight: 72.7 kg  Height: 5' 6.5" (1.689 m)     Physical Exam Vitals and nursing note reviewed.  Constitutional:      General: She is not in acute distress.    Appearance: Normal appearance. She is not ill-appearing, toxic-appearing or diaphoretic.  HENT:     Head: Normocephalic and atraumatic.     Nose: Nose normal.     Mouth/Throat:     Mouth: Mucous membranes are moist.     Pharynx: Oropharynx is clear.  Eyes:     General: No scleral icterus.    Extraocular Movements: Extraocular movements intact.  Cardiovascular:     Rate and Rhythm: Normal rate and regular rhythm.     Heart sounds: Normal heart sounds. No murmur heard.    No friction rub. No gallop.  Pulmonary:     Effort: Pulmonary effort is normal. No respiratory distress.     Breath sounds: Normal breath sounds. No wheezing, rhonchi or rales.  Abdominal:     General: Bowel sounds are normal. There is no distension.     Palpations: Abdomen is soft.     Tenderness: There is no abdominal tenderness. There is no guarding or rebound.  Musculoskeletal:     Cervical back: Neck supple.     Right lower leg: No edema.     Left lower leg: No edema.  Skin:    General: Skin is warm and dry.     Coloration: Skin is not jaundiced or pale.  Neurological:     General: No focal deficit present.     Mental Status: She is alert and oriented to person, place, and time. Mental status is at baseline.  Psychiatric:        Mood and Affect: Mood normal.        Behavior: Behavior normal.        Thought Content: Thought content normal.        Judgment: Judgment normal.      Assessment:  Ms. Joanne Coffey is a 77 y.o. female  who presents today for Colonoscopy for surveillance-personal history of colon polyps.  Plan:  Colonoscopy with possible intervention today  Colonoscopy with possible biopsy, control of bleeding, polypectomy, and interventions as  necessary has been discussed with the patient/patient representative. Informed consent was obtained from the patient/patient representative after explaining the indication, nature, and risks of the procedure including but not limited to death, bleeding, perforation, missed neoplasm/lesions, cardiorespiratory compromise, and reaction to medications. Opportunity for questions was given and appropriate answers were provided. Patient/patient representative has verbalized understanding is amenable to undergoing the procedure.   Jaynie Collins, DO  [facility]  Portions of the record may have been created with voice recognition software. Occasional wrong-word or 'sound-a-like' substitutions may have occurred due to the inherent limitations of voice recognition software.  Read the chart carefully and recognize, using context, where substitutions may have occurred.

## 2022-09-20 ENCOUNTER — Ambulatory Visit: Payer: Medicare Other | Admitting: Anesthesiology

## 2022-09-20 ENCOUNTER — Encounter
Admission: RE | Disposition: A | Discharge status: Home / Self Care | Payer: Self-pay | Source: Home / Self Care | Attending: Gastroenterology

## 2022-09-20 ENCOUNTER — Other Ambulatory Visit: Payer: Self-pay

## 2022-09-20 ENCOUNTER — Encounter: Payer: Self-pay | Admitting: Gastroenterology

## 2022-09-20 ENCOUNTER — Ambulatory Visit
Admission: RE | Admit: 2022-09-20 | Discharge: 2022-09-20 | Disposition: A | Discharge status: Home / Self Care | Payer: Medicare Other | Attending: Gastroenterology | Admitting: Gastroenterology

## 2022-09-20 DIAGNOSIS — Z90710 Acquired absence of both cervix and uterus: Secondary | ICD-10-CM | POA: Diagnosis not present

## 2022-09-20 DIAGNOSIS — D123 Benign neoplasm of transverse colon: Secondary | ICD-10-CM | POA: Diagnosis not present

## 2022-09-20 DIAGNOSIS — K5730 Diverticulosis of large intestine without perforation or abscess without bleeding: Secondary | ICD-10-CM | POA: Diagnosis not present

## 2022-09-20 DIAGNOSIS — Z9049 Acquired absence of other specified parts of digestive tract: Secondary | ICD-10-CM | POA: Diagnosis not present

## 2022-09-20 DIAGNOSIS — Z1211 Encounter for screening for malignant neoplasm of colon: Secondary | ICD-10-CM | POA: Diagnosis present

## 2022-09-20 DIAGNOSIS — K641 Second degree hemorrhoids: Secondary | ICD-10-CM | POA: Diagnosis not present

## 2022-09-20 DIAGNOSIS — D120 Benign neoplasm of cecum: Secondary | ICD-10-CM | POA: Insufficient documentation

## 2022-09-20 DIAGNOSIS — Z86010 Personal history of colonic polyps: Secondary | ICD-10-CM | POA: Diagnosis not present

## 2022-09-20 HISTORY — DX: Benign neoplasm, unspecified site: D36.9

## 2022-09-20 HISTORY — DX: Essential (primary) hypertension: I10

## 2022-09-20 HISTORY — DX: Deficiency of other specified B group vitamins: E53.8

## 2022-09-20 HISTORY — PX: COLONOSCOPY WITH PROPOFOL: SHX5780

## 2022-09-20 SURGERY — COLONOSCOPY WITH PROPOFOL
Anesthesia: General

## 2022-09-20 MED ORDER — PROPOFOL 500 MG/50ML IV EMUL
INTRAVENOUS | Status: DC | PRN
  Administered 2022-09-20: 165 ug/kg/min via INTRAVENOUS

## 2022-09-20 MED ORDER — LIDOCAINE HCL (CARDIAC) PF 100 MG/5ML IV SOSY
PREFILLED_SYRINGE | INTRAVENOUS | Status: DC | PRN
  Administered 2022-09-20: 100 mg via INTRAVENOUS

## 2022-09-20 MED ORDER — EPHEDRINE SULFATE (PRESSORS) 50 MG/ML IJ SOLN
INTRAMUSCULAR | Status: DC | PRN
  Administered 2022-09-20: 10 mg via INTRAVENOUS

## 2022-09-20 MED ORDER — SODIUM CHLORIDE 0.9 % IV SOLN: INTRAVENOUS | Status: DC

## 2022-09-20 MED ORDER — PROPOFOL 10 MG/ML IV BOLUS
INTRAVENOUS | Status: DC | PRN
  Administered 2022-09-20: 60 mg via INTRAVENOUS

## 2022-09-20 NOTE — Interval H&P Note (Signed)
History and Physical Interval Note: Preprocedure H&P from 09/20/22  was reviewed and there was no interval change after seeing and examining the patient.  Written consent was obtained from the patient after discussion of risks, benefits, and alternatives. Patient has consented to proceed with Colonoscopy with possible intervention   09/20/2022 12:47 PM  [name]  has presented today for surgery, with the diagnosis of V12.72 (ICD-9-CM) - Z86.010 (ICD-10-CM) - History of colon polyps.  The various methods of treatment have been discussed with the patient and family. After consideration of risks, benefits and other options for treatment, the patient has consented to  Procedure(s): COLONOSCOPY WITH PROPOFOL (N/A) as a surgical intervention.  The patient's history has been reviewed, patient examined, no change in status, stable for surgery.  I have reviewed the patient's chart and labs.  Questions were answered to the patient's satisfaction.     Joanne Coffey

## 2022-09-20 NOTE — Anesthesia Procedure Notes (Signed)
Procedure Name: General with mask airway Date/Time: 09/20/2022 1:04 PM  Performed by: Mohammed Kindle, CRNAPre-anesthesia Checklist: Patient identified, Emergency Drugs available, Suction available and Patient being monitored Patient Re-evaluated:Patient Re-evaluated prior to induction Oxygen Delivery Method: Simple face mask Induction Type: IV induction Placement Confirmation: CO2 detector, positive ETCO2 and breath sounds checked- equal and bilateral Dental Injury: Teeth and Oropharynx as per pre-operative assessment

## 2022-09-20 NOTE — Anesthesia Preprocedure Evaluation (Addendum)
Anesthesia Evaluation  Patient identified by MRN, date of birth, ID band Patient awake    Reviewed: Allergy & Precautions, NPO status , Patient's Chart, lab work & pertinent test results  History of Anesthesia Complications Negative for: history of anesthetic complications  Airway Mallampati: II   Neck ROM: Full    Dental   Bridge :   Pulmonary neg pulmonary ROS   Pulmonary exam normal breath sounds clear to auscultation       Cardiovascular hypertension, Normal cardiovascular exam+ dysrhythmias (a fib)  Rhythm:Regular Rate:Normal  ECG 04/22/22:  Atrial fibrillation with premature ventricular or aberrantly conducted complexes  Low voltage QRS  Cannot rule out Anterior infarct , age undetermined   Echo 08/03/20: NORMAL LEFT VENTRICULAR SYSTOLIC FUNCTION with an estimated EF > 55% NORMAL RIGHT VENTRICULAR SYSTOLIC FUNCTION Mild Bi-Atrial enlargement Mild Mitral, Tricuspid, and Pulmonic valve regurgitation AVA(VTI) = 2.5cm^2 No Valvular stenosis observed    Neuro/Psych negative neurological ROS     GI/Hepatic negative GI ROS,,,  Endo/Other  negative endocrine ROS    Renal/GU negative Renal ROS     Musculoskeletal  (+) Arthritis ,    Abdominal   Peds  Hematology negative hematology ROS (+)   Anesthesia Other Findings Cardiology note 04/22/22:  77 y.o. female with  Atrial fibrillation Hypertension  Plan  Atrial fibrillation, asymptomatic, shown on EKG from 08/01/2020, EKG today shows atrial fibrillation. Continue aspirin. Recommend anticoagulation, patient has deferred at this time. Hypertension, well controlled, BP is 134/82, continue lisinopril-HCTZ Have patient follow up in 6 months  Return in about 6 months (around 10/21/2022).  Reproductive/Obstetrics                             Anesthesia Physical Anesthesia Plan  ASA: 2  Anesthesia Plan: General   Post-op Pain Management:     Induction: Intravenous  PONV Risk Score and Plan: 3 and Propofol infusion, TIVA and Treatment may vary due to age or medical condition  Airway Management Planned: Natural Airway  Additional Equipment:   Intra-op Plan:   Post-operative Plan:   Informed Consent: I have reviewed the patients History and Physical, chart, labs and discussed the procedure including the risks, benefits and alternatives for the proposed anesthesia with the patient or authorized representative who has indicated his/her understanding and acceptance.       Plan Discussed with: CRNA  Anesthesia Plan Comments: (LMA/GETA backup discussed.  Patient consented for risks of anesthesia including but not limited to:  - adverse reactions to medications - damage to eyes, teeth, lips or other oral mucosa - nerve damage due to positioning  - sore throat or hoarseness - damage to heart, brain, nerves, lungs, other parts of body or loss of life  Informed patient about role of CRNA in peri- and intra-operative care.  Patient voiced understanding.)        Anesthesia Quick Evaluation

## 2022-09-20 NOTE — Transfer of Care (Signed)
Immediate Anesthesia Transfer of Care Note  Patient: KAHLIE DEUTSCHER  Procedure(s) Performed: COLONOSCOPY WITH PROPOFOL  Patient Location: Endoscopy Unit  Anesthesia Type:General  Level of Consciousness: drowsy and patient cooperative  Airway & Oxygen Therapy: Patient Spontanous Breathing and Patient connected to face mask oxygen  Post-op Assessment: Report given to RN and Post -op Vital signs reviewed and stable  Post vital signs: Reviewed and stable  Last Vitals:  Vitals Value Taken Time  BP 107/62 09/20/22 1327  Temp    Pulse 66 09/20/22 1328  Resp 17 09/20/22 1328  SpO2 100 % 09/20/22 1328  Vitals shown include unvalidated device data.  Last Pain:  Vitals:   09/20/22 1327  TempSrc:   PainSc: Asleep         Complications: No notable events documented.

## 2022-09-20 NOTE — Op Note (Signed)
[facility] Patient Name: Joanne Coffey Procedure Date: 09/20/2022 12:39 PM MRN: [id number] Account #: [id number] Date of Birth: 1946/03/21 Admit Type: Outpatient Age: 77 Room: [facility] ENDO ROOM 1 Gender: Female Note Status: Finalized Instrument Name: Peds Colonoscope [id number] Procedure:             Colonoscopy Indications:           High risk colon cancer surveillance: Personal history                         of colonic polyps Providers:             Trenda Moots, DO Referring MD:          Jaynie Collins DO, DO (Referring MD) Medicines:             Monitored Anesthesia Care Complications:         No immediate complications. Estimated blood loss:                         Minimal. Procedure:             Pre-Anesthesia Assessment:                        - Prior to the procedure, a History and Physical was                         performed, and patient medications and allergies were                         reviewed. The patient is competent. The risks and                         benefits of the procedure and the sedation options and                         risks were discussed with the patient. All questions                         were answered and informed consent was obtained.                         Patient identification and proposed procedure were                         verified by the physician, the nurse, the anesthetist                         and the technician in the endoscopy suite. Mental                         Status Examination: alert and oriented. Airway                         Examination: normal oropharyngeal airway and neck                         mobility. Respiratory Examination: clear to  auscultation. CV Examination: RRR, no murmurs, no S3                         or S4. Prophylactic Antibiotics: The patient does not                         require prophylactic antibiotics. Prior                          Anticoagulants: The patient has taken no anticoagulant                         or antiplatelet agents. ASA Grade Assessment: II - A                         patient with mild systemic disease. After reviewing                         the risks and benefits, the patient was deemed in                         satisfactory condition to undergo the procedure. The                         anesthesia plan was to use monitored anesthesia care                         (MAC). Immediately prior to administration of                         medications, the patient was re-assessed for adequacy                         to receive sedatives. The heart rate, respiratory                         rate, oxygen saturations, blood pressure, adequacy of                         pulmonary ventilation, and response to care were                         monitored throughout the procedure. The physical                         status of the patient was re-assessed after the                         procedure.                        After obtaining informed consent, the colonoscope was                         passed under direct vision. Throughout the procedure,                         the patient's blood pressure, pulse, and oxygen  saturations were monitored continuously. The                         Colonoscope was introduced through the anus and                         advanced to the the terminal ileum, with                         identification of the appendiceal orifice and IC                         valve. The colonoscopy was performed without                         difficulty. The patient tolerated the procedure well.                         The quality of the bowel preparation was evaluated                         using the BBPS [facility] Bowel Preparation Scale) with                         scores of: Right Colon = 3, Transverse Colon = 3 and                         Left Colon = 3  (entire mucosa seen well with no                         residual staining, small fragments of stool or opaque                         liquid). The total BBPS score equals 9. The terminal                         ileum, ileocecal valve, appendiceal orifice, and                         rectum were photographed. Findings:      The perianal and digital rectal examinations were normal. Pertinent       negatives include normal sphincter tone.      The terminal ileum appeared normal. Estimated blood loss: none.      Multiple small-mouthed diverticula were found in the sigmoid colon.       Estimated blood loss: none.      Non-bleeding internal hemorrhoids were found during retroflexion. The       hemorrhoids were Grade II (internal hemorrhoids that prolapse but reduce       spontaneously). Estimated blood loss: none.      Two sessile polyps were found in the transverse colon and cecum. The       polyps were 1 to 2 mm in size. These polyps were removed with a jumbo       cold forceps. Resection and retrieval were complete. Estimated blood       loss was minimal.      Two sessile polyps were found in the transverse colon. The polyps were 3  to 4 mm in size. These polyps were removed with a cold snare. Resection       and retrieval were complete. Estimated blood loss was minimal.      The exam was otherwise without abnormality on direct and retroflexion       views.      Retroflexion in the right colon was performed. Impression:            - The examined portion of the ileum was normal.                        - Diverticulosis in the sigmoid colon.                        - Non-bleeding internal hemorrhoids.                        - Two 1 to 2 mm polyps in the transverse colon and in                         the cecum, removed with a jumbo cold forceps. Resected                         and retrieved.                        - Two 3 to 4 mm polyps in the transverse colon,                          removed with a cold snare. Resected and retrieved.                        - The examination was otherwise normal on direct and                         retroflexion views. Recommendation:        - Patient has a contact number available for                         emergencies. The signs and symptoms of potential                         delayed complications were discussed with the patient.                         Return to normal activities tomorrow. Written                         discharge instructions were provided to the patient.                        - Discharge patient to home.                        - Resume previous diet.                        - Continue present medications.                        -  No ibuprofen, naproxen, or other non-steroidal                         anti-inflammatory drugs for 5 days after polyp removal.                        - Await pathology results.                        - Repeat colonoscopy for surveillance based on                         pathology results.                        - Return to referring physician as previously                         scheduled.                        - The findings and recommendations were discussed with                         the patient. Procedure Code(s):     --- Professional ---                        [phone], Colonoscopy, flexible; with removal of                         tumor(s), polyp(s), or other lesion(s) by snare                         technique                        45380, 59, Colonoscopy, flexible; with biopsy, single                         or multiple Diagnosis Code(s):     --- Professional ---                        Z86.010, Personal history of colonic polyps                        K64.1, Second degree hemorrhoids                        D12.0, Benign neoplasm of cecum                        D12.3, Benign neoplasm of transverse colon (hepatic                         flexure or splenic flexure)                         K57.30, Diverticulosis of large intestine without                         perforation or abscess without bleeding CPT copyright 2022 American [employer] Association. All  rights reserved. The codes documented in this report are preliminary and upon coder review may  be revised to meet current compliance requirements. Attending Participation:      I personally performed the entire procedure. Elfredia Nevins, DO Jaynie Collins DO, DO 09/20/2022 1:33:08 PM This report has been signed electronically. Number of Addenda: 0 Note Initiated On: 09/20/2022 12:39 PM Scope Withdrawal Time: 0 hours 15 minutes 42 seconds  Total Procedure Duration: 0 hours 24 minutes 59 seconds  Estimated Blood Loss:  Estimated blood loss was minimal.      [facility]

## 2022-09-23 ENCOUNTER — Encounter: Payer: Self-pay | Admitting: Gastroenterology

## 2022-09-23 NOTE — Anesthesia Postprocedure Evaluation (Signed)
Anesthesia Post Note  Patient: Joanne Coffey  Procedure(s) Performed: COLONOSCOPY WITH PROPOFOL  Patient location during evaluation: PACU Anesthesia Type: General Level of consciousness: awake and alert, oriented and patient cooperative Pain management: pain level controlled Vital Signs Assessment: post-procedure vital signs reviewed and stable Respiratory status: spontaneous breathing, nonlabored ventilation and respiratory function stable Cardiovascular status: blood pressure returned to baseline and stable Postop Assessment: adequate PO intake Anesthetic complications: no   No notable events documented.   Last Vitals:  Vitals:   09/20/22 1347 09/20/22 1359  BP: 132/74 136/77  Pulse: 66 69  Resp: 19 18  Temp:    SpO2: 98% 98%    Last Pain:  Vitals:   09/20/22 1347  TempSrc:   PainSc: 0-No pain                 Reed Breech

## 2023-05-23 ENCOUNTER — Other Ambulatory Visit: Payer: Self-pay | Admitting: Internal Medicine

## 2023-05-23 DIAGNOSIS — Z1231 Encounter for screening mammogram for malignant neoplasm of breast: Secondary | ICD-10-CM

## 2023-06-30 ENCOUNTER — Ambulatory Visit
Admission: RE | Admit: 2023-06-30 | Discharge: 2023-06-30 | Disposition: A | Discharge status: Home / Self Care | Payer: Medicare Other | Source: Ambulatory Visit | Attending: Internal Medicine | Admitting: Internal Medicine

## 2023-06-30 DIAGNOSIS — Z1231 Encounter for screening mammogram for malignant neoplasm of breast: Secondary | ICD-10-CM | POA: Insufficient documentation

## 2023-09-15 IMAGING — MG MM DIGITAL SCREENING BILAT W/ TOMO AND CAD
6 of 10 series · 6 of 30 positions shown · non-contrast
Comparison: Previous exam(s).

CLINICAL DATA: Screening.

EXAM:
DIGITAL SCREENING BILATERAL MAMMOGRAM WITH TOMOSYNTHESIS AND CAD
TECHNIQUE: Bilateral screening digital craniocaudal and mediolateral oblique
mammograms were obtained. Bilateral screening digital breast
tomosynthesis was performed. The images were evaluated with
computer-aided detection.

[R CC synth-2D]
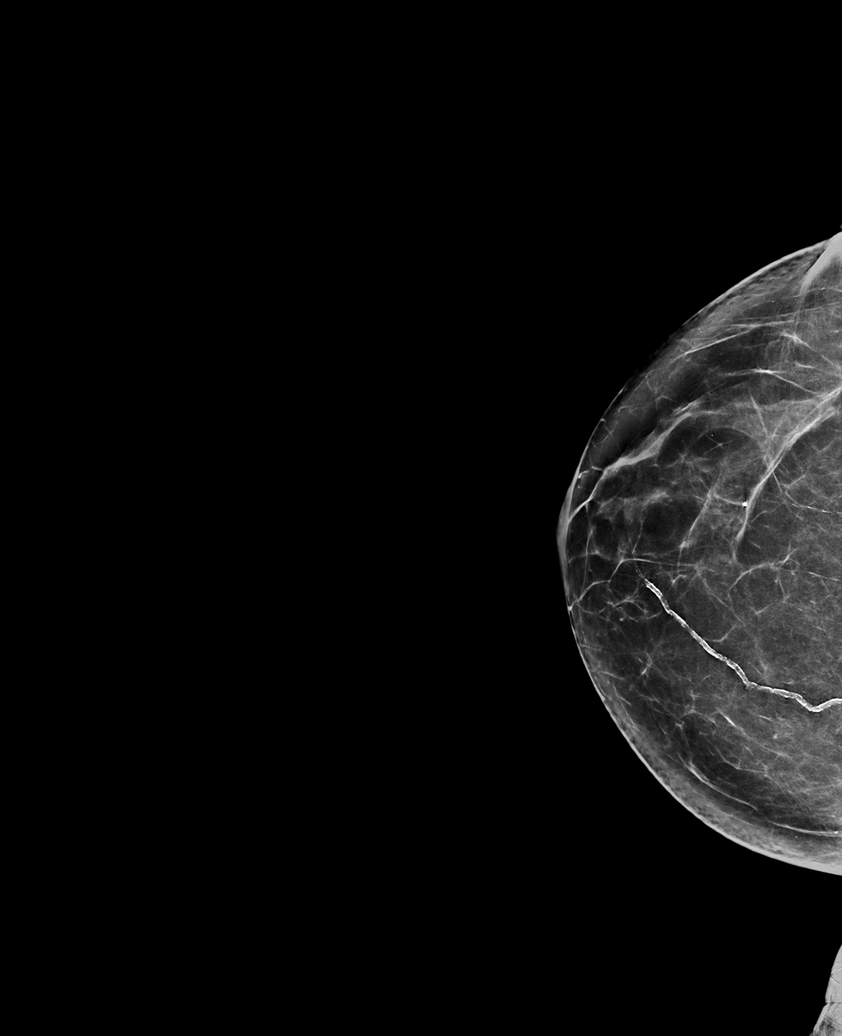

[L CC synth-2D (1 of 2)]
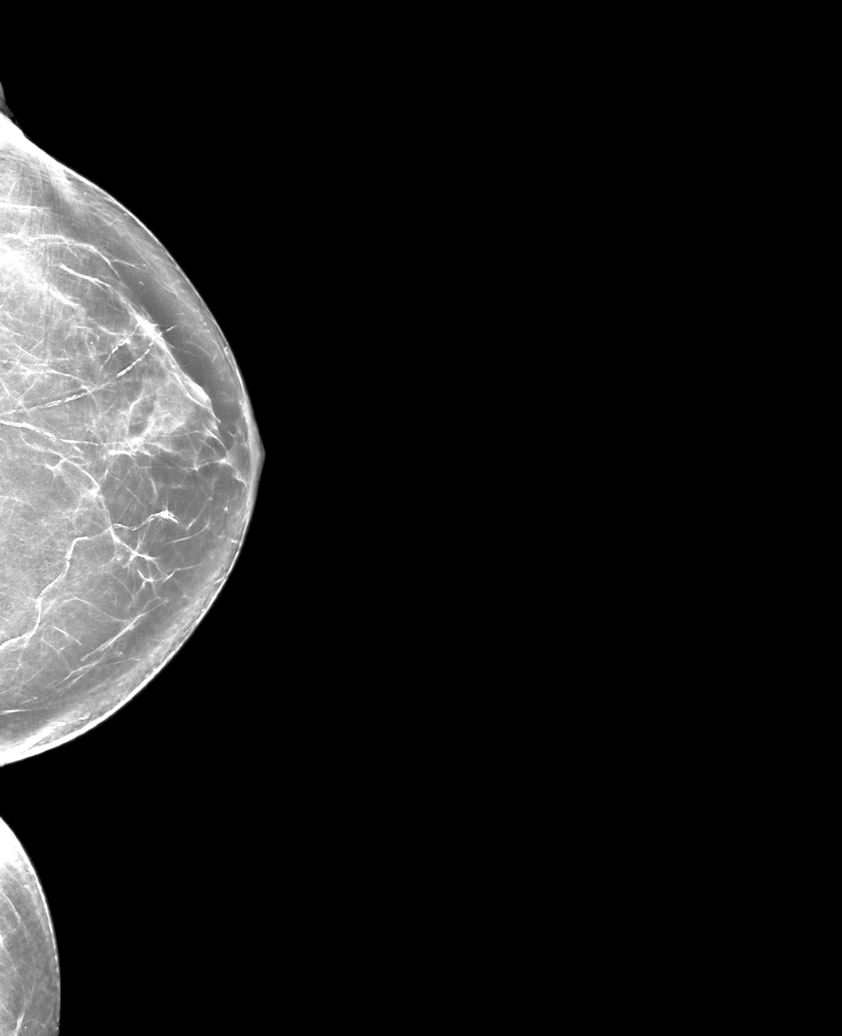

[L CC synth-2D (2 of 2)]
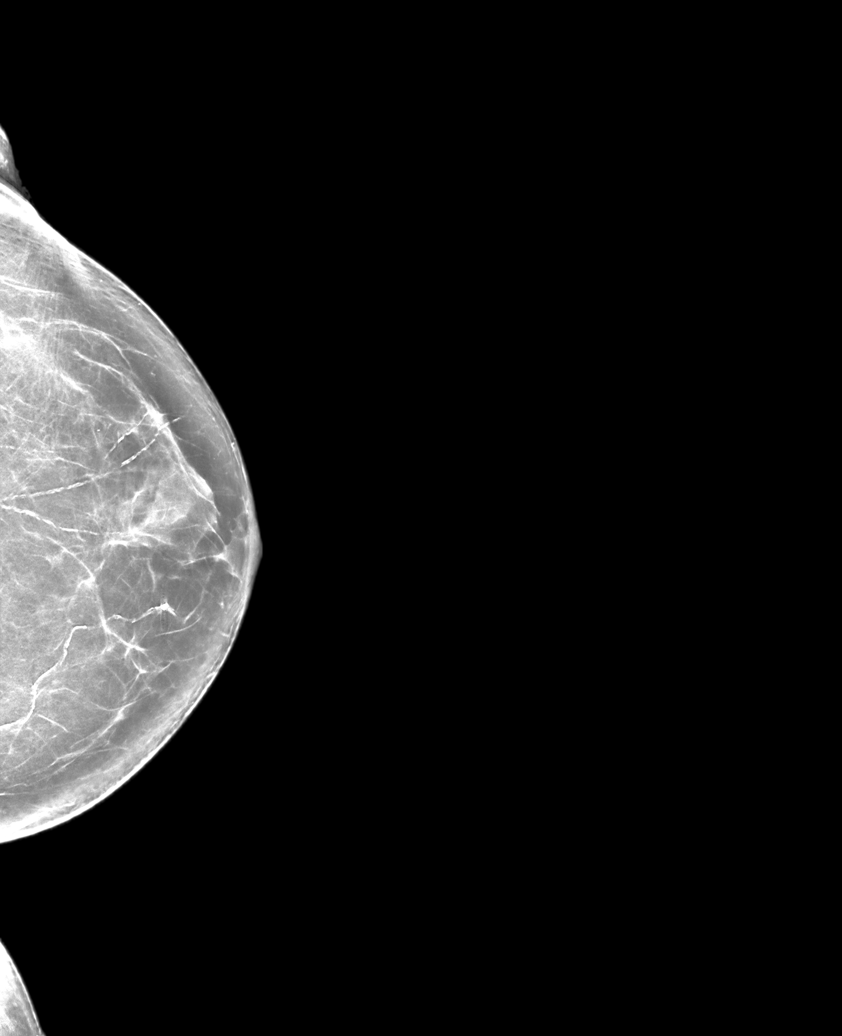

[L MLO synth-2D]
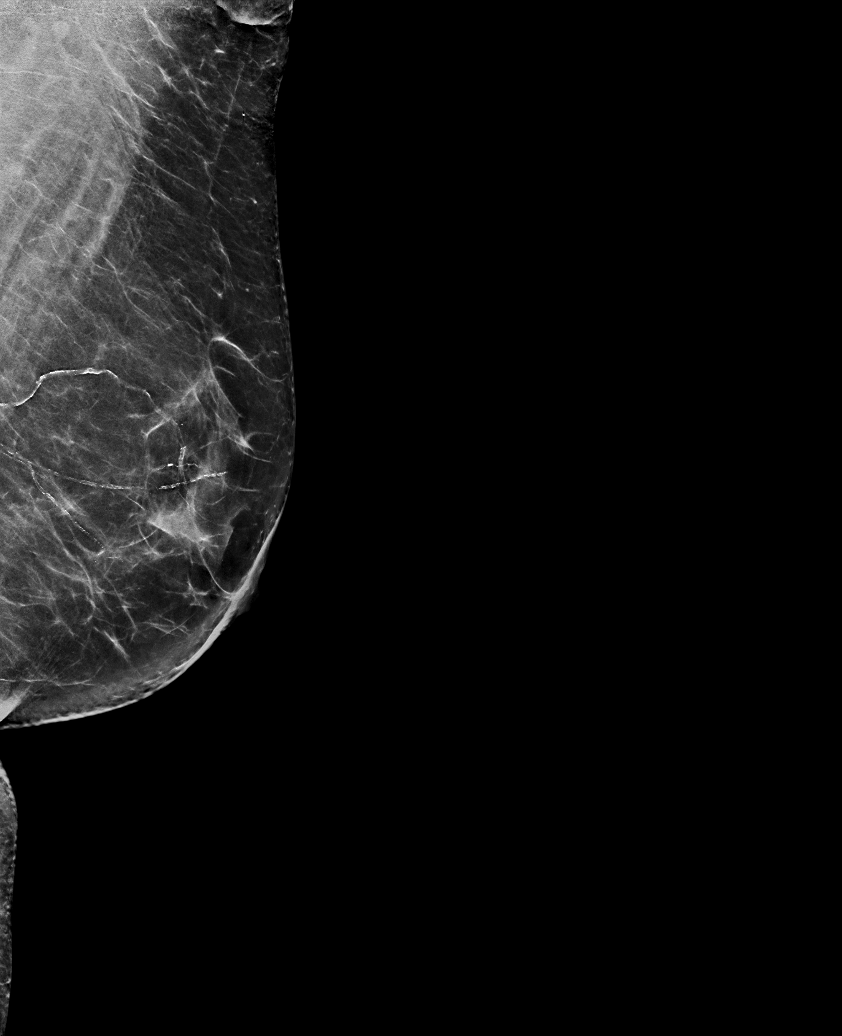

[R MLO synth-2D]
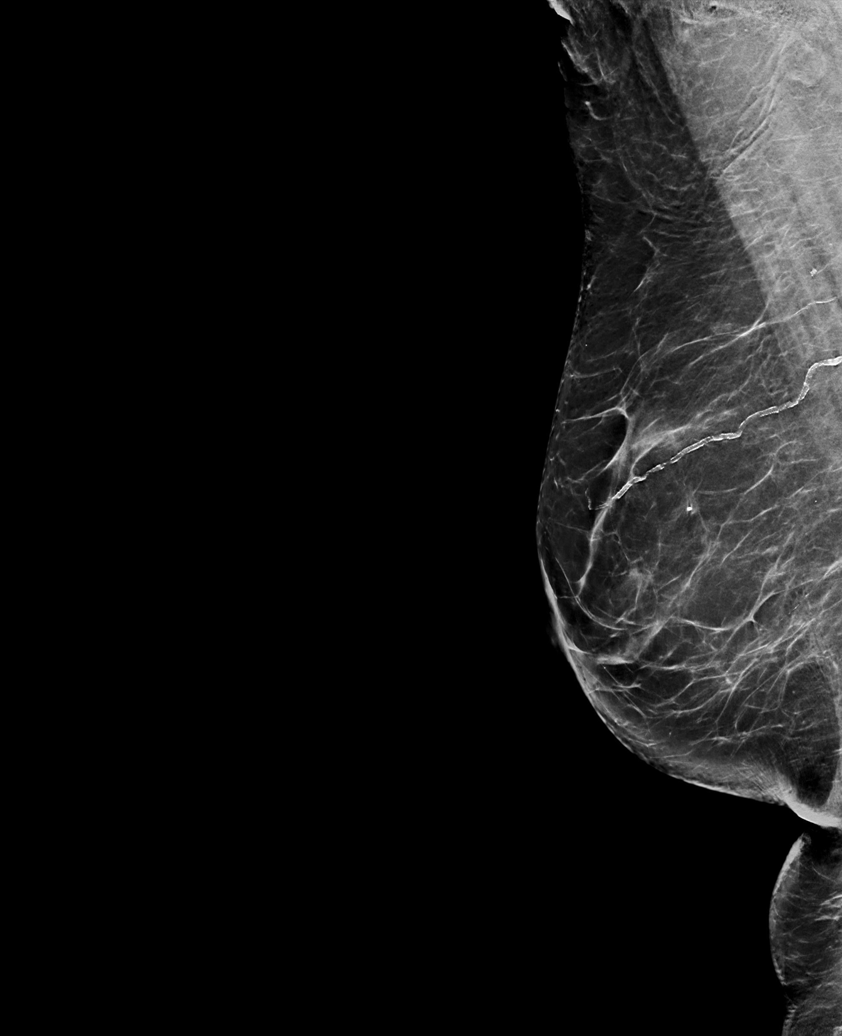

[R MLO tomo · tomo slice 41/82.0]
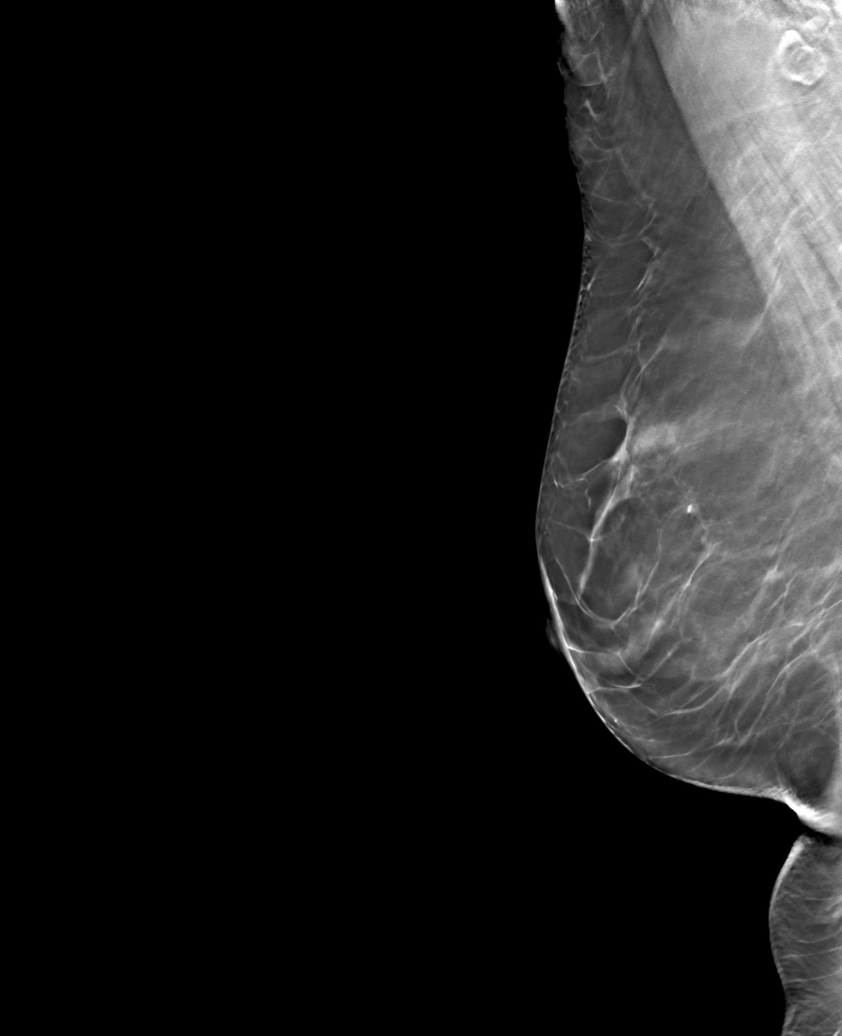

[6 of 30 positions shown; findings below may reference images not displayed]

ACR Breast Density Category c: The breast tissue is heterogeneously
dense, which may obscure small masses.
FINDINGS: There are no findings suspicious for malignancy.
IMPRESSION: No mammographic evidence of malignancy. A result letter of this
screening mammogram will be mailed directly to the patient.

RECOMMENDATION:
Screening mammogram in one year. (Code:Q3-W-BC3)

BI-RADS CATEGORY  1: Negative.

## 2023-11-06 ENCOUNTER — Ambulatory Visit
Admission: RE | Admit: 2023-11-06 | Discharge: 2023-11-06 | Disposition: A | Discharge status: Home / Self Care | Source: Ambulatory Visit | Attending: Internal Medicine | Admitting: Internal Medicine

## 2023-11-06 ENCOUNTER — Other Ambulatory Visit: Payer: Self-pay | Admitting: Internal Medicine

## 2023-11-06 DIAGNOSIS — R1084 Generalized abdominal pain: Secondary | ICD-10-CM | POA: Insufficient documentation

## 2023-11-06 DIAGNOSIS — R634 Abnormal weight loss: Secondary | ICD-10-CM | POA: Insufficient documentation
